# Patient Record
Sex: Male | Born: 1970 | Race: Black or African American | Hispanic: No | Smoking: Never smoker
Health system: Southern US, Community
[De-identification: ages and names within clinical notes are randomized; demographics above are authoritative.]

## PROBLEM LIST (undated history)

## (undated) DIAGNOSIS — J45909 Unspecified asthma, uncomplicated: Secondary | ICD-10-CM

---

## 2017-11-18 ENCOUNTER — Emergency Department (HOSPITAL_COMMUNITY)
Admission: EM | Admit: 2017-11-18 | Discharge: 2017-11-18 | Disposition: A | Discharge status: Home / Self Care | Payer: Self-pay | Attending: Emergency Medicine | Admitting: Emergency Medicine

## 2017-11-18 ENCOUNTER — Encounter (HOSPITAL_COMMUNITY): Payer: Self-pay | Admitting: Emergency Medicine

## 2017-11-18 ENCOUNTER — Emergency Department (HOSPITAL_COMMUNITY): Payer: Self-pay

## 2017-11-18 DIAGNOSIS — J45909 Unspecified asthma, uncomplicated: Secondary | ICD-10-CM | POA: Insufficient documentation

## 2017-11-18 DIAGNOSIS — Z5321 Procedure and treatment not carried out due to patient leaving prior to being seen by health care provider: Secondary | ICD-10-CM | POA: Insufficient documentation

## 2017-11-18 DIAGNOSIS — R0602 Shortness of breath: Secondary | ICD-10-CM | POA: Insufficient documentation

## 2017-11-18 HISTORY — DX: Unspecified asthma, uncomplicated: J45.909

## 2017-11-18 MED ORDER — ALBUTEROL SULFATE (2.5 MG/3ML) 0.083% IN NEBU
5.0000 mg | INHALATION_SOLUTION | Freq: Once | RESPIRATORY_TRACT | Status: AC
  Administered 2017-11-18: 5 mg via RESPIRATORY_TRACT
  Filled 2017-11-18: qty 6

## 2017-11-18 MED ORDER — ALBUTEROL SULFATE (2.5 MG/3ML) 0.083% IN NEBU: 5.0000 mg | INHALATION_SOLUTION | Freq: Once | RESPIRATORY_TRACT | Status: DC

## 2017-11-18 NOTE — ED Triage Notes (Signed)
Pt presents to ED for assessment after developing cold like symptoms yesterday.  Today he states cough increased and worsening SOB.  Patient not able to take a depp breath in triage, speaking in whispers.  Pt denies CP.

## 2019-07-02 IMAGING — CR DG CHEST 2V
2 series · 2 of 2 positions shown · non-contrast
Comparison: None.

CLINICAL DATA: Shortness of breath with cough

EXAM:
CHEST - 2 VIEW

[chest pa]
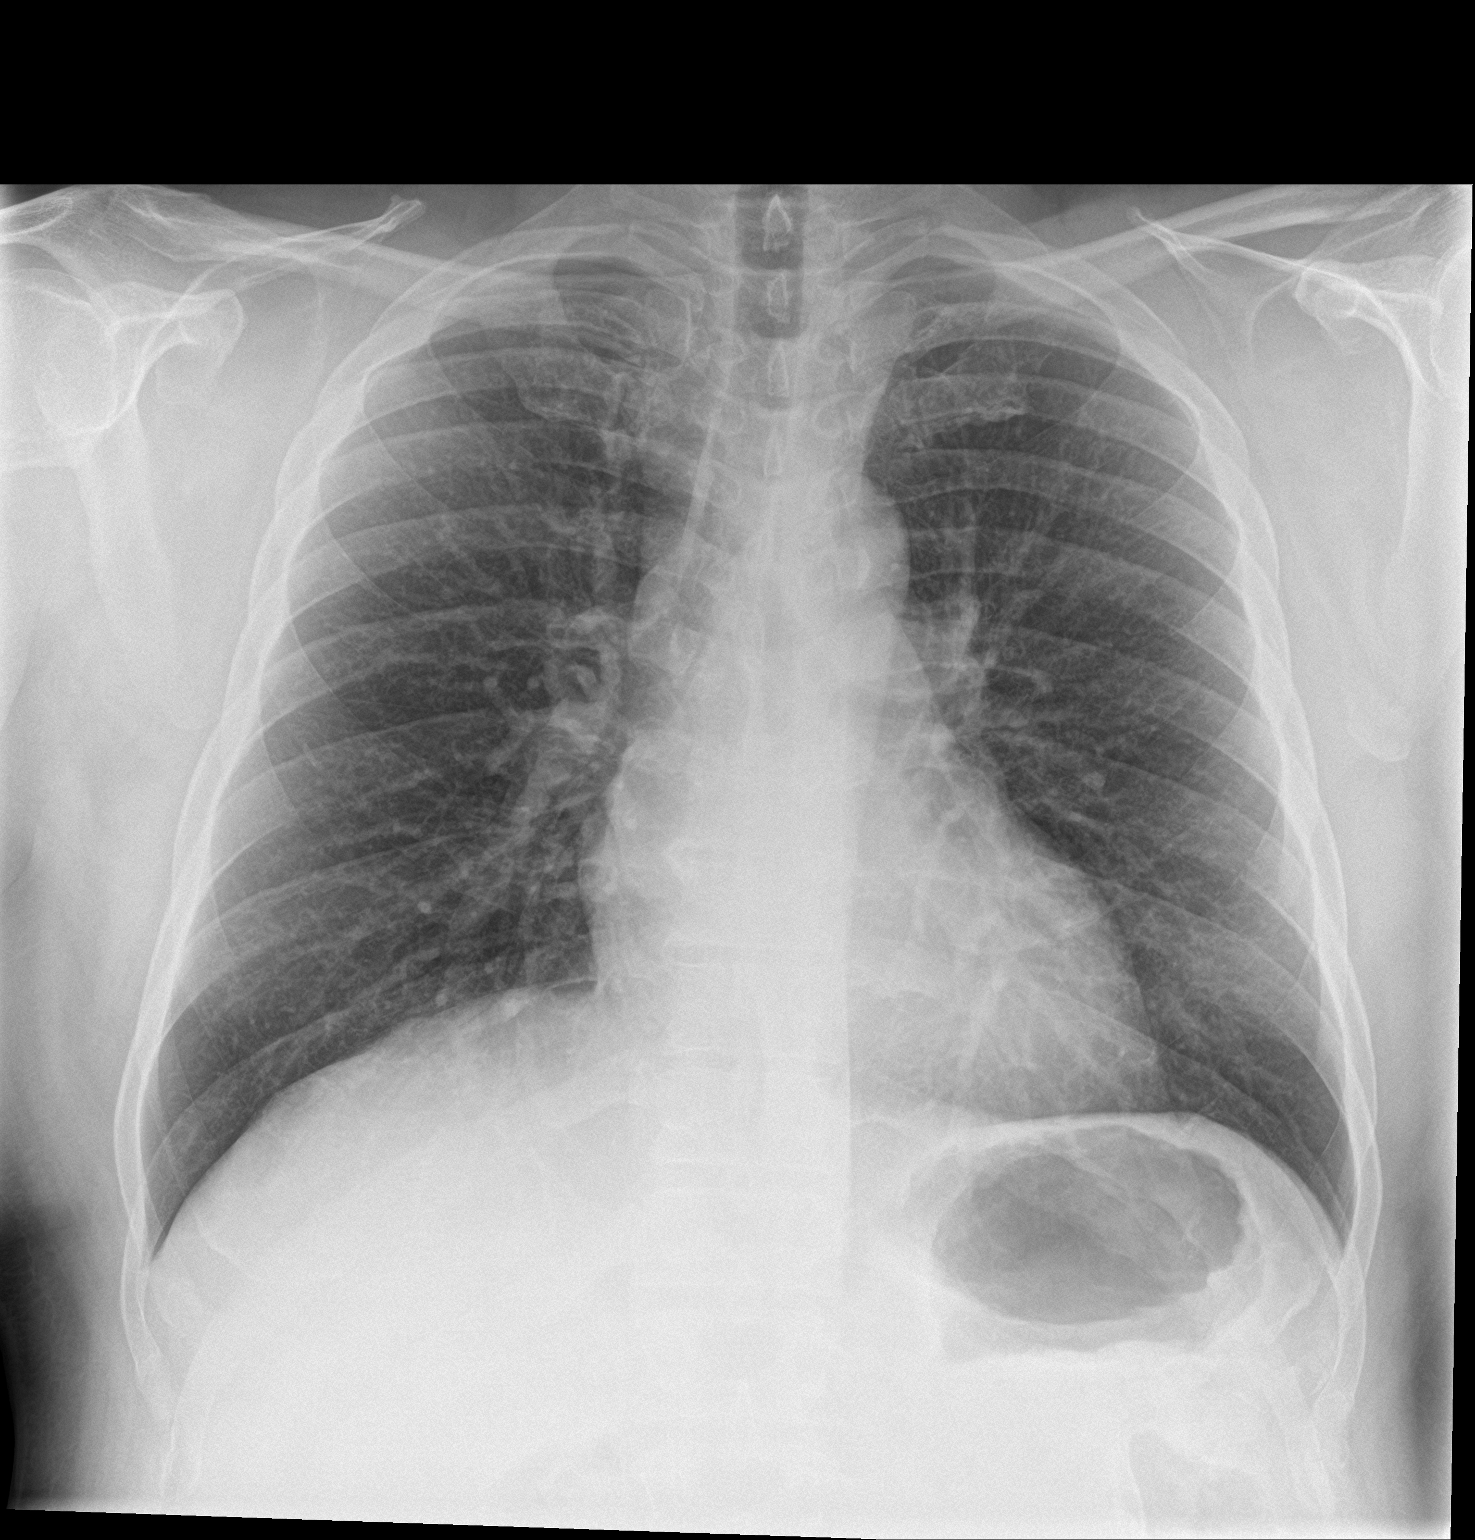

[chest lat]
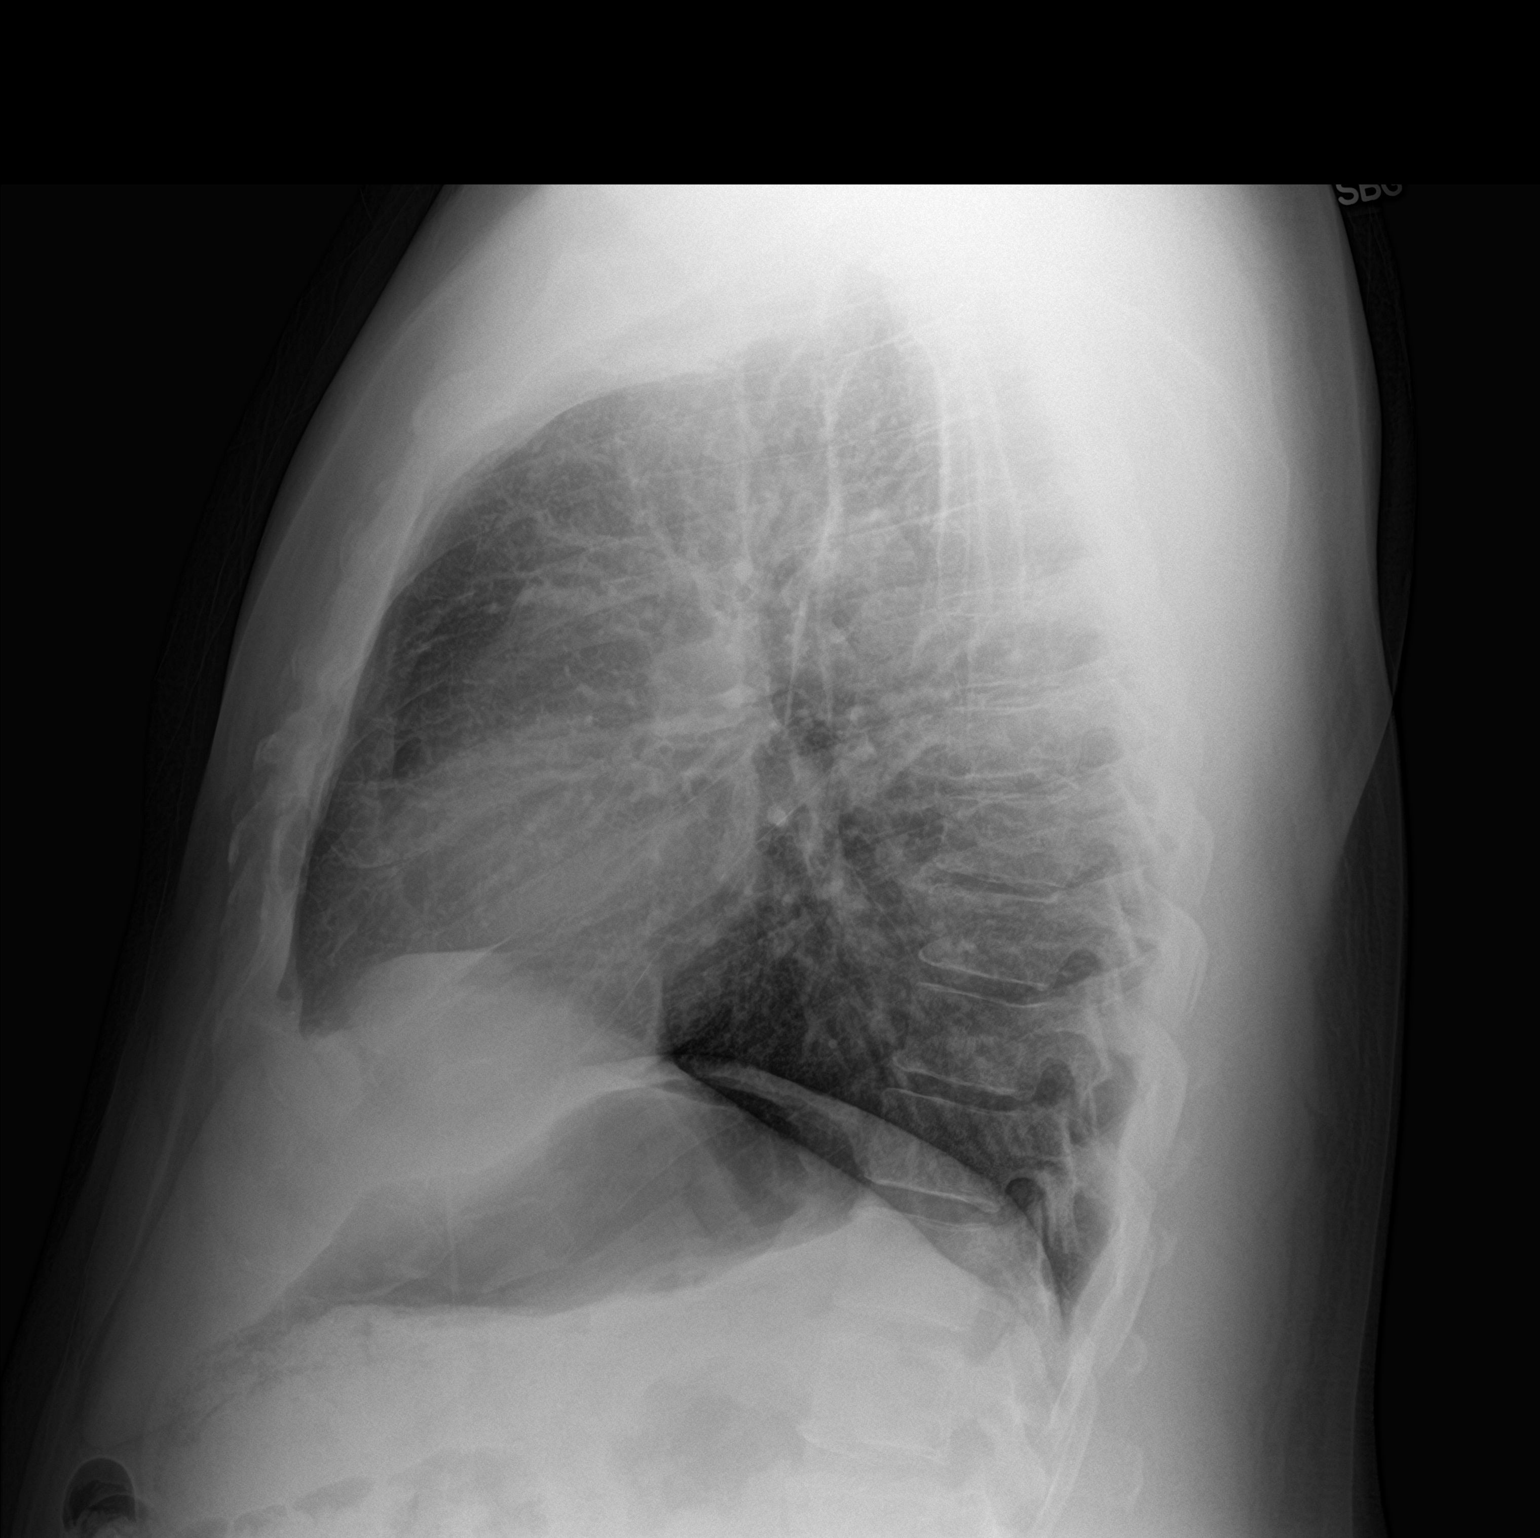

[2 of 2 positions shown; findings below may reference images not displayed]

FINDINGS: Lungs are clear. Heart size and pulmonary vascularity are normal. No
adenopathy. No bone lesions.
IMPRESSION: No edema or consolidation.
